# Patient Record
Sex: Female | Born: 1995 | Race: White | Hispanic: No | Marital: Single | Smoking: Current every day smoker
Health system: Southern US, Community
[De-identification: ages and names within clinical notes are randomized; demographics above are authoritative.]

---

## 2018-02-10 ENCOUNTER — Encounter (HOSPITAL_COMMUNITY): Payer: Self-pay | Admitting: Emergency Medicine

## 2018-02-10 ENCOUNTER — Observation Stay (HOSPITAL_COMMUNITY)
Admission: EM | Admit: 2018-02-10 | Discharge: 2018-02-10 | Disposition: A | Discharge status: Home / Self Care | Payer: Self-pay | Attending: Emergency Medicine | Admitting: Emergency Medicine

## 2018-02-10 ENCOUNTER — Other Ambulatory Visit: Payer: Self-pay

## 2018-02-10 ENCOUNTER — Emergency Department (HOSPITAL_COMMUNITY): Payer: Self-pay

## 2018-02-10 DIAGNOSIS — Z885 Allergy status to narcotic agent status: Secondary | ICD-10-CM | POA: Insufficient documentation

## 2018-02-10 DIAGNOSIS — F17210 Nicotine dependence, cigarettes, uncomplicated: Secondary | ICD-10-CM | POA: Insufficient documentation

## 2018-02-10 DIAGNOSIS — G510 Bell's palsy: Principal | ICD-10-CM | POA: Insufficient documentation

## 2018-02-10 DIAGNOSIS — R569 Unspecified convulsions: Secondary | ICD-10-CM

## 2018-02-10 LAB — CBC WITH DIFFERENTIAL/PLATELET
Abs Immature Granulocytes: 0.04 10*3/uL (ref 0.00–0.07)
Basophils Absolute: 0.1 10*3/uL (ref 0.0–0.1)
Basophils Relative: 1 %
EOS ABS: 0.2 10*3/uL (ref 0.0–0.5)
EOS PCT: 2 %
HCT: 39.8 % (ref 36.0–46.0)
HEMOGLOBIN: 12.4 g/dL (ref 12.0–15.0)
Immature Granulocytes: 0 %
LYMPHS ABS: 2.4 10*3/uL (ref 0.7–4.0)
Lymphocytes Relative: 23 %
MCH: 28.2 pg (ref 26.0–34.0)
MCHC: 31.2 g/dL (ref 30.0–36.0)
MCV: 90.7 fL (ref 80.0–100.0)
MONOS PCT: 7 %
Monocytes Absolute: 0.7 10*3/uL (ref 0.1–1.0)
Neutro Abs: 6.9 10*3/uL (ref 1.7–7.7)
Neutrophils Relative %: 67 %
PLATELETS: 306 10*3/uL (ref 150–400)
RBC: 4.39 MIL/uL (ref 3.87–5.11)
RDW: 12.6 % (ref 11.5–15.5)
WBC: 10.3 10*3/uL (ref 4.0–10.5)
nRBC: 0 % (ref 0.0–0.2)

## 2018-02-10 LAB — COMPREHENSIVE METABOLIC PANEL
ALBUMIN: 4.3 g/dL (ref 3.5–5.0)
ALK PHOS: 77 U/L (ref 38–126)
ALT: 21 U/L (ref 0–44)
AST: 25 U/L (ref 15–41)
Anion gap: 9 (ref 5–15)
BUN: 12 mg/dL (ref 6–20)
CALCIUM: 8.9 mg/dL (ref 8.9–10.3)
CHLORIDE: 103 mmol/L (ref 98–111)
CO2: 24 mmol/L (ref 22–32)
CREATININE: 0.83 mg/dL (ref 0.44–1.00)
GFR calc Af Amer: >60 mL/min (ref >60)
GFR calc non Af Amer: >60 mL/min (ref >60)
GLUCOSE: 97 mg/dL (ref 70–99)
Potassium: 3.4 mmol/L — ABNORMAL LOW (ref 3.5–5.1)
SODIUM: 136 mmol/L (ref 135–145)
TOTAL PROTEIN: 8.1 g/dL (ref 6.5–8.1)
Total Bilirubin: 0.5 mg/dL (ref 0.3–1.2)

## 2018-02-10 LAB — I-STAT BETA HCG BLOOD, ED (MC, WL, AP ONLY)

## 2018-02-10 MED ORDER — PREDNISONE 20 MG PO TABS: ORAL_TABLET | ORAL | Status: DC

## 2018-02-10 MED ORDER — PREDNISONE 50 MG PO TABS
60.0000 mg | ORAL_TABLET | Freq: Once | ORAL | Status: AC
  Administered 2018-02-10: 60 mg via ORAL
  Filled 2018-02-10: qty 1

## 2018-02-10 MED ORDER — VALACYCLOVIR HCL 500 MG PO TABS
1000.0000 mg | ORAL_TABLET | Freq: Once | ORAL | Status: AC
  Administered 2018-02-10: 1000 mg via ORAL
  Filled 2018-02-10: qty 2

## 2018-02-10 MED ORDER — ARTIFICIAL TEARS OPHTHALMIC OINT: TOPICAL_OINTMENT | OPHTHALMIC | 0 refills | Status: DC

## 2018-02-10 MED ORDER — VALACYCLOVIR HCL 1 G PO TABS: 1000.0000 mg | ORAL_TABLET | Freq: Three times a day (TID) | ORAL | 0 refills | Status: AC

## 2018-02-10 MED ORDER — HYPROMELLOSE (GONIOSCOPIC) 2.5 % OP SOLN: 1.0000 [drp] | Freq: Four times a day (QID) | OPHTHALMIC | 12 refills | Status: DC | PRN

## 2018-02-10 NOTE — ED Provider Notes (Signed)
[facility] EMERGENCY DEPARTMENT Provider Note   CSN: [id number] Arrival date & time: 02/10/18  1650     History   Chief Complaint Chief Complaint  Patient presents with  . Numbness    HPI Morgan Stanley is a 22 y.o. female.  Patient presents with left facial drooping for a few days.  The history is provided by the patient. No language interpreter was used.  Weakness  Primary symptoms include focal weakness. This is a new problem. The current episode started 2 days ago. The problem has not changed since onset.There was left facial focality noted. There has been no fever. Pertinent negatives include no shortness of breath, no chest pain and no headaches. There were no medications administered prior to arrival. Associated medical issues do not include trauma.    History reviewed. No pertinent past medical history.  Patient Active Problem List   Diagnosis Date Noted  . Seizure (HCC) 02/10/2018    History reviewed. No pertinent surgical history.   OB History   None      Home Medications    Prior to Admission medications   Medication Sig Start Date End Date Taking? Authorizing Provider  aspirin-acetaminophen-caffeine (EXCEDRIN MIGRAINE) [phone] MG tablet Take 2 tablets by mouth every 6 (six) hours as needed for headache.   Yes [provider]  ibuprofen (ADVIL,MOTRIN) 200 MG tablet Take 400 mg by mouth every 6 (six) hours as needed.   Yes [provider]  artificial tears (LACRILUBE) OINT ophthalmic ointment Use and affected by at night and tape your eye shut 02/10/18   Bethann Berkshire, MD  hydroxypropyl methylcellulose / hypromellose (ISOPTO TEARS / GONIOVISC) 2.5 % ophthalmic solution Place 1 drop into the left eye 4 (four) times daily as needed for dry eyes. 02/10/18   Bethann Berkshire, MD  predniSONE (DELTASONE) 20 MG tablet Take 3 tablets once a day 02/10/18   Bethann Berkshire, MD  valACYclovir (VALTREX) 1000 MG tablet Take 1 tablet (1,000 mg total)  by mouth 3 (three) times daily for 7 days. 02/10/18 02/17/18  Bethann Berkshire, MD    Family History History reviewed. No pertinent family history.  Social History Social History   Tobacco Use  . Smoking status: Current Every Day Smoker  . Smokeless tobacco: Never Used  Substance Use Topics  . Alcohol use: Yes    Comment: occasionally  . Drug use: Never     Allergies   Codeine   Review of Systems Review of Systems  Constitutional: Negative for appetite change and fatigue.  HENT: Negative for congestion, ear discharge and sinus pressure.        Left facial weakness  Eyes: Negative for discharge.  Respiratory: Negative for cough and shortness of breath.   Cardiovascular: Negative for chest pain.  Gastrointestinal: Negative for abdominal pain and diarrhea.  Genitourinary: Negative for frequency and hematuria.  Musculoskeletal: Negative for back pain.  Skin: Negative for rash.  Neurological: Positive for focal weakness and weakness. Negative for seizures and headaches.  Psychiatric/Behavioral: Negative for hallucinations.     Physical Exam Updated Vital Signs BP (!) 150/87 (BP Location: Right Arm)   Pulse 93   Temp 98 F (36.7 C) (Oral)   Resp 18   Ht 5\' 7"  (1.702 m)   Wt 77.1 kg   LMP 02/08/2018   SpO2 100%   BMI 26.63 kg/m   Physical Exam  Constitutional: She is oriented to person, place, and time. She appears well-developed.  HENT:  Head: Normocephalic.  Patient has weakness  to the left side of face consistent with Bell's palsy  Eyes: Conjunctivae and EOM are normal. No scleral icterus.  Neck: Neck supple. No thyromegaly present.  Cardiovascular: Normal rate and regular rhythm. Exam reveals no gallop and no friction rub.  No murmur heard. Pulmonary/Chest: No stridor. She has no wheezes. She has no rales. She exhibits no tenderness.  Abdominal: She exhibits no distension. There is no tenderness. There is no rebound.  Musculoskeletal: Normal range of  motion. She exhibits no edema.  Lymphadenopathy:    She has no cervical adenopathy.  Neurological: She is oriented to person, place, and time. She exhibits normal muscle tone. Coordination normal.  Skin: No rash noted. No erythema.  Psychiatric: She has a normal mood and affect. Her behavior is normal.     ED Treatments / Results  Labs (all labs ordered are listed, but only abnormal results are displayed) Labs Reviewed  COMPREHENSIVE METABOLIC PANEL - Abnormal; Notable for the following components:      Result Value   Potassium 3.4 (*)    All other components within normal limits  CBC WITH DIFFERENTIAL/PLATELET  I-STAT BETA HCG BLOOD, ED ([facility], [facility], [facility] ONLY)    EKG None  Radiology Ct Head Wo Contrast  Result Date: 02/10/2018 CLINICAL DATA:  Initial evaluation for acute altered mental status. Left-sided facial numbness and paralysis. EXAM: CT HEAD WITHOUT CONTRAST TECHNIQUE: Contiguous axial images were obtained from the base of the skull through the vertex without intravenous contrast. COMPARISON:  None. FINDINGS: Brain: Cerebral volume within normal limits for patient age. No evidence for acute intracranial hemorrhage. No findings to suggest acute large vessel territory infarct. No mass lesion, midline shift, or mass effect. Ventricles are normal in size without evidence for hydrocephalus. No extra-axial fluid collection identified. Vascular: No hyperdense vessel identified. Skull: Scalp soft tissues demonstrate no acute abnormality. Calvarium intact. Sinuses/Orbits: Globes and orbital soft tissues within normal limits. Visualized paranasal sinuses are clear. No mastoid effusion. IMPRESSION: Normal head CT.  No acute intracranial abnormality. Electronically Signed   By: Rise MuBenjamin  McClintock M.D.   On: 02/10/2018 18:32    Procedures Procedures (including critical care time)  Medications Ordered in ED Medications  predniSONE (DELTASONE) tablet 60 mg (60 mg Oral Given 02/10/18 2121)    valACYclovir (VALTREX) tablet 1,000 mg (1,000 mg Oral Given 02/10/18 2123)     Initial Impression / Assessment and Plan / ED Course  I have reviewed the triage vital signs and the nursing notes.  Pertinent labs & imaging results that were available during my care of the patient were reviewed by me and considered in my medical decision making (see chart for details).     Labs and CT scan unremarkable.  Patient has Bell's palsy and is discharged home on prednisone and Valtrex along with Lacri-Lube ointment and artificial tears.  She will follow-up with her family doctor or neurology  Final Clinical Impressions(s) / ED Diagnoses   Final diagnoses:  Bell's palsy    ED Discharge Orders         Ordered    predniSONE (DELTASONE) 20 MG tablet     02/10/18 2132    valACYclovir (VALTREX) 1000 MG tablet  3 times daily     02/10/18 2132    artificial tears (LACRILUBE) OINT ophthalmic ointment     02/10/18 2132    hydroxypropyl methylcellulose / hypromellose (ISOPTO TEARS / GONIOVISC) 2.5 % ophthalmic solution  4 times daily PRN     02/10/18 2132  Bethann Berkshire, MD 02/10/18 2136

## 2018-02-10 NOTE — ED Triage Notes (Signed)
Patient states she had virus last week and started having facial paralysis and numbness to left side of face prior to going to bed last night. Denies any other weakness or symptoms. Patient ambulatory, alert, and oriented at triage.

## 2018-02-10 NOTE — Discharge Instructions (Signed)
Follow-up with your doctor or Dr. Gerilyn Pilgrimoonquah in 1 to 2 weeks.  Use the artificial tears to keep your eye moist during the day.  Put the ointment in your eye at night and tape your eye shut

## 2019-12-29 IMAGING — CT CT HEAD W/O CM
3 series · 15 of 47 positions shown, 18 images · non-contrast
Comparison: None.

CLINICAL DATA: Initial evaluation for acute altered mental status.
Left-sided facial numbness and paralysis.

EXAM:
CT HEAD WITHOUT CONTRAST
TECHNIQUE: Contiguous axial images were obtained from the base of the skull
through the vertex without intravenous contrast.

[Series 2: head wo · axial · 0.45mm/px · z∈[-110,+15]mm · 9 of 31 slices shown, 12 images]
[im 3/31  brain]
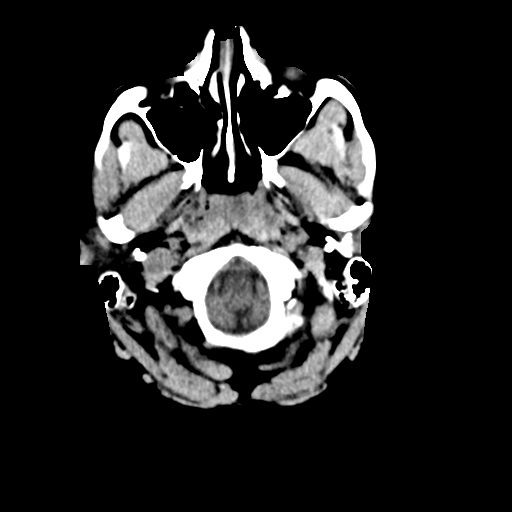
[im 3/31  bone]
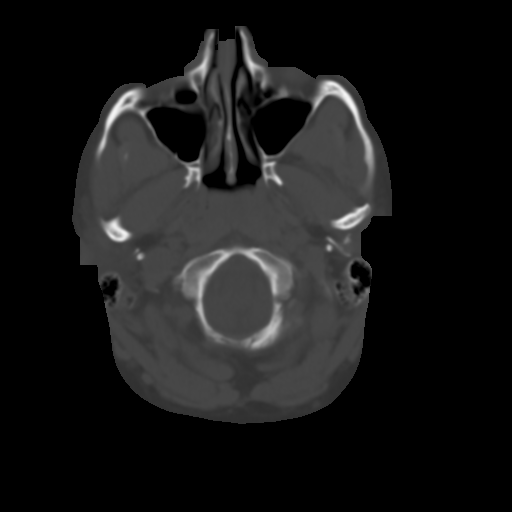
[im 6/31  brain]
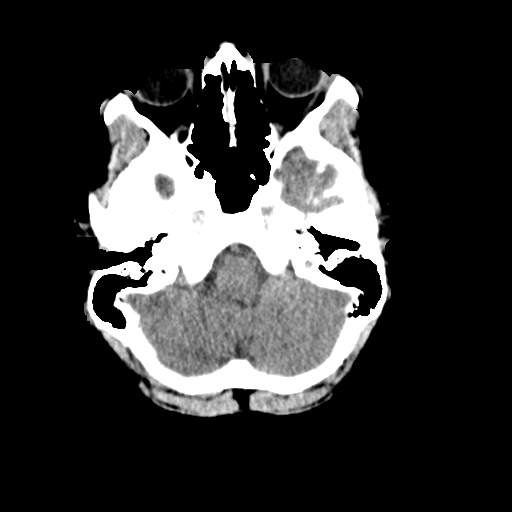
[im 9/31  brain]
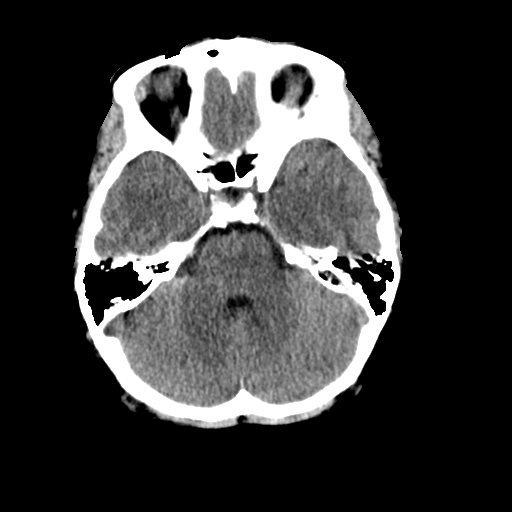
[im 12/31  brain]
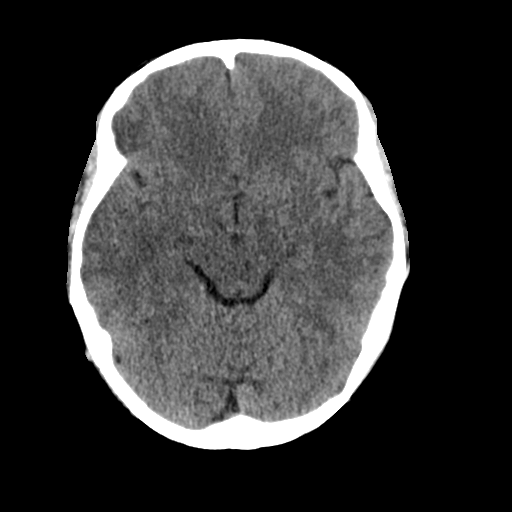
[im 16/31  brain]
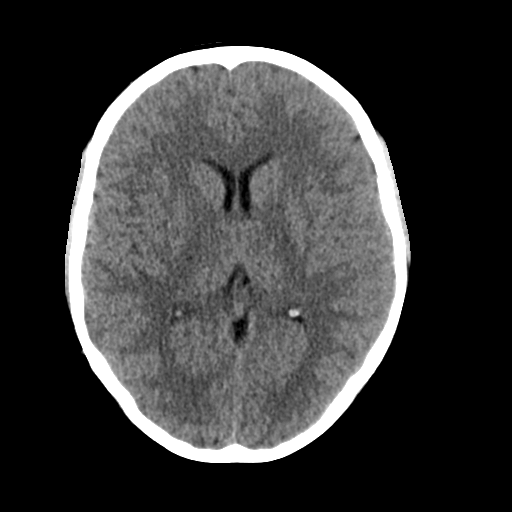
[im 16/31  bone]
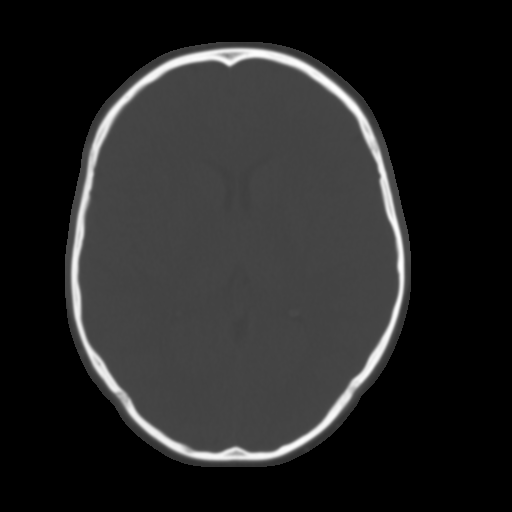
[im 19/31  brain]
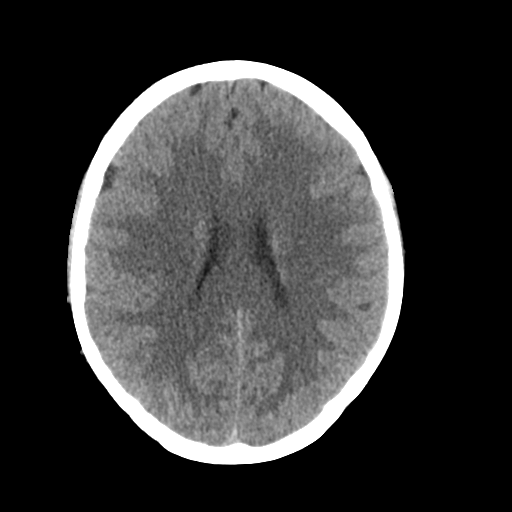
[im 22/31  brain]
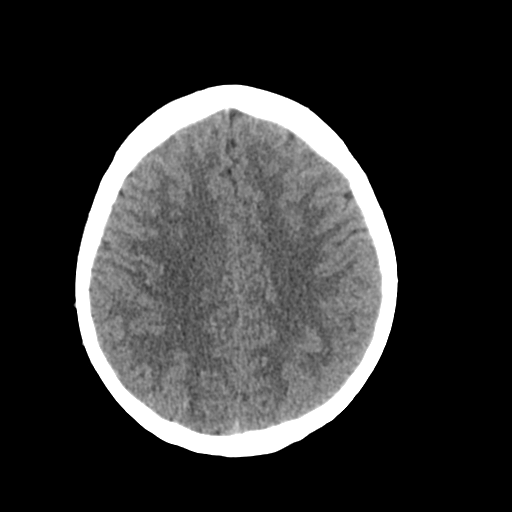
[im 25/31  brain]
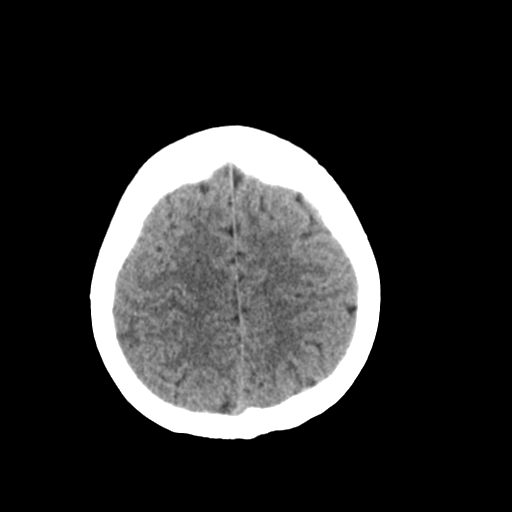
[im 28/31  brain]
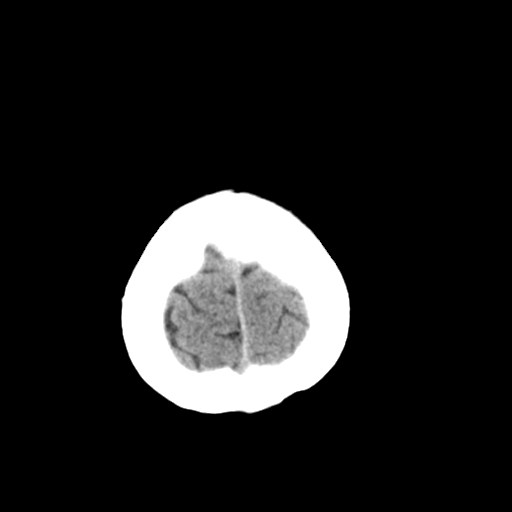
[im 28/31  bone]
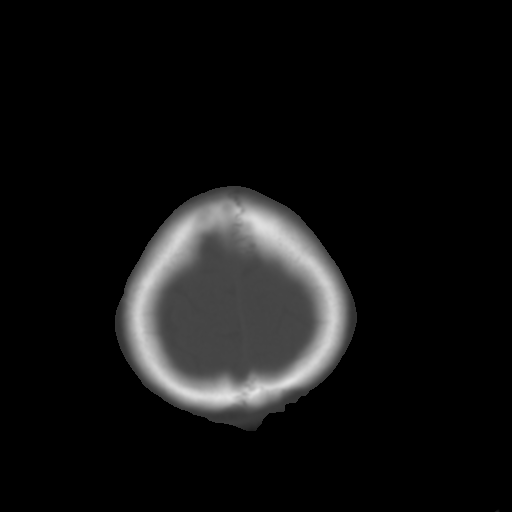

[Series 4: coronal soft tissue · coronal · 0.32mm/px · 3 of 68 slices shown]
[im 23/68  brain]
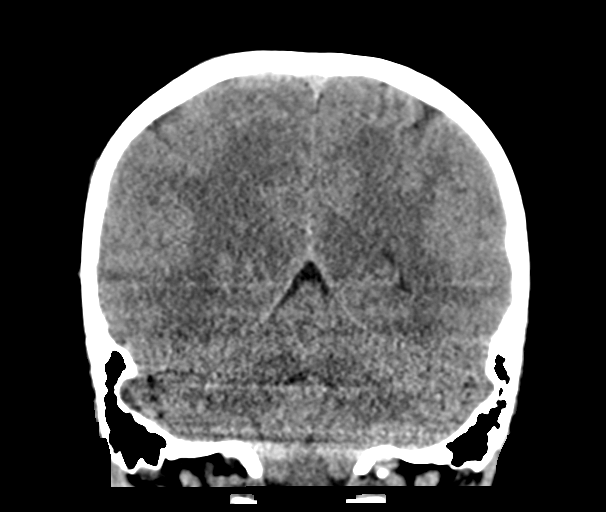
[im 30/68  brain]
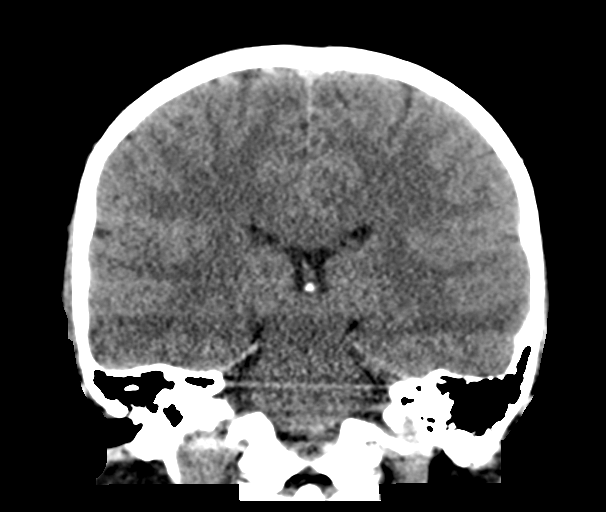
[im 38/68  brain]
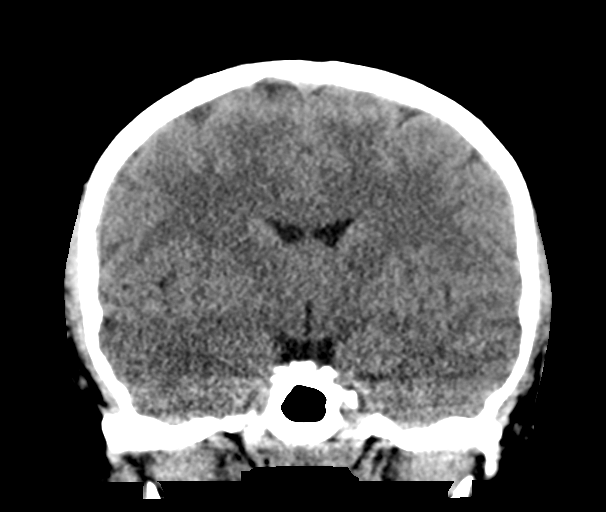

[Series 5: sagittal soft tissue · sagittal · 0.34mm/px · 3 of 63 slices shown]
[im 21/63  brain]
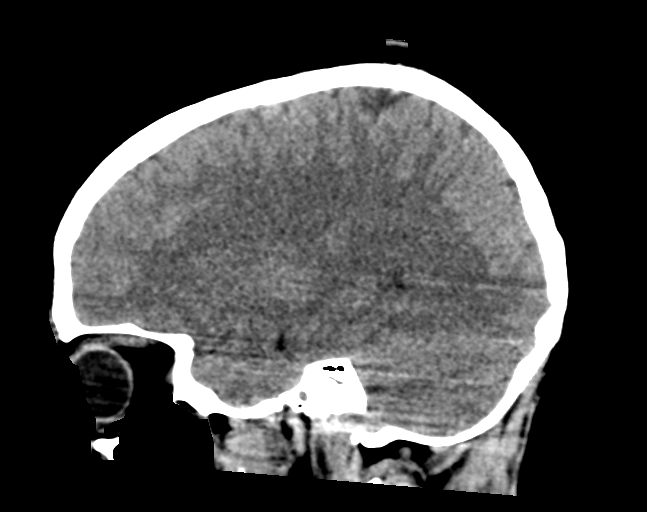
[im 32/63  brain]
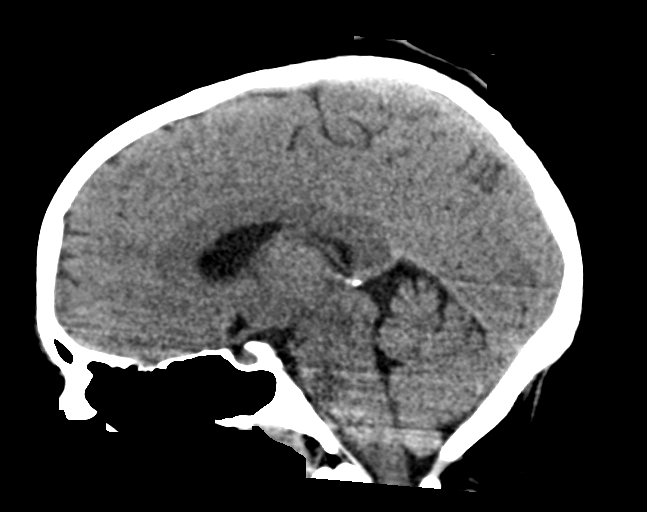
[im 42/63  brain]
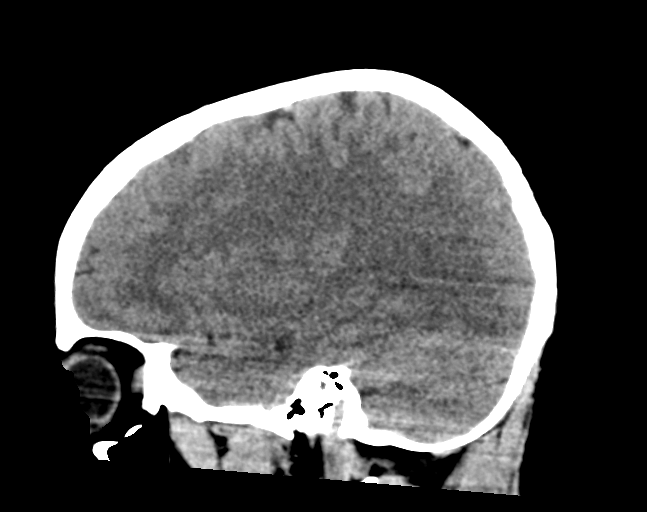

[15 of 47 positions shown; findings below may reference images not displayed]

FINDINGS: Brain: Cerebral volume within normal limits for patient age.

No evidence for acute intracranial hemorrhage. No findings to
suggest acute large vessel territory infarct. No mass lesion,
midline shift, or mass effect. Ventricles are normal in size without
evidence for hydrocephalus. No extra-axial fluid collection
identified.

Vascular: No hyperdense vessel identified.

Skull: Scalp soft tissues demonstrate no acute abnormality.
Calvarium intact.

Sinuses/Orbits: Globes and orbital soft tissues within normal
limits.

Visualized paranasal sinuses are clear. No mastoid effusion.
IMPRESSION: Normal head CT.  No acute intracranial abnormality.

## 2021-05-02 DIAGNOSIS — H5213 Myopia, bilateral: Secondary | ICD-10-CM | POA: Diagnosis not present

## 2021-05-23 DIAGNOSIS — Z973 Presence of spectacles and contact lenses: Secondary | ICD-10-CM | POA: Diagnosis not present

## 2021-05-23 DIAGNOSIS — H1033 Unspecified acute conjunctivitis, bilateral: Secondary | ICD-10-CM | POA: Diagnosis not present

## 2021-05-23 DIAGNOSIS — H5710 Ocular pain, unspecified eye: Secondary | ICD-10-CM | POA: Diagnosis not present

## 2021-05-23 DIAGNOSIS — Z880 Allergy status to penicillin: Secondary | ICD-10-CM | POA: Diagnosis not present

## 2023-01-31 ENCOUNTER — Telehealth: Payer: Self-pay | Admitting: *Deleted

## 2023-01-31 NOTE — Telephone Encounter (Signed)
Ok for pt to keep apt  Copied from CRM [id number]. Topic: Appointments - Appointment Cancel/Reschedule >> Jan 31, 2023 11:17 AM Lovey Newcomer R wrote: Pt just got a message to reschedule for an AM appt for 02/16/2023. Please respond via Mychart or return call at #[phone] advising what times are available.

## 2023-02-16 ENCOUNTER — Ambulatory Visit: Payer: Commercial Managed Care - PPO | Admitting: Family Medicine

## 2023-02-16 ENCOUNTER — Encounter: Payer: Self-pay | Admitting: Family Medicine

## 2023-02-16 VITALS — BP 128/83 | HR 111 | Temp 98.1°F | Ht 67.0 in | Wt 262.0 lb

## 2023-02-16 DIAGNOSIS — R000 Tachycardia, unspecified: Secondary | ICD-10-CM | POA: Diagnosis not present

## 2023-02-16 DIAGNOSIS — N920 Excessive and frequent menstruation with regular cycle: Secondary | ICD-10-CM

## 2023-02-16 DIAGNOSIS — K219 Gastro-esophageal reflux disease without esophagitis: Secondary | ICD-10-CM | POA: Diagnosis not present

## 2023-02-16 MED ORDER — NORETHIN ACE-ETH ESTRAD-FE 1-20 MG-MCG PO TABS: 1.0000 | ORAL_TABLET | Freq: Every day | ORAL | 3 refills | Status: DC

## 2023-02-16 MED ORDER — OMEPRAZOLE 20 MG PO CPDR: 20.0000 mg | DELAYED_RELEASE_CAPSULE | Freq: Every day | ORAL | 3 refills | Status: DC

## 2023-02-16 NOTE — Progress Notes (Signed)
New Patient Office Visit  Subjective    Patient ID: Morgan Stanley, female    DOB: February 11, 1996  Age: 27 y.o. MRN: [id number]  CC:  Chief Complaint  Patient presents with   Establish Care   Gastroesophageal Reflux    Takes otc omeprazole    abnormal peroids    HPI Morgan Stanley presents to establish care.   She has a cycle monthly that last for about 2 weeks. Ongoing for 1.5 years. Bleeding with heavier flow for 2 days, though she has not saturated a pad or tampon within an hour or two. Remaining days are moderate to light. Cycles are not painful. She is not currently sexually active and has not been for 2 years. Was previously sexually active. She has never had a pap. She was on OCPs at one point while on Accutane. When on OCPs she had bleeding daily for 3 weeks so she stopped taking it. She is currently on her cycle.   History of GERD. Has regurgitation at night every night. She was taking omeprazole with good relief but stopped taking due to cost.   HR is up today. 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 She denies palpitations, dizziness, shortness of breath, edema, chest pain. She has an apple watch where she can check her HR.   Outpatient Encounter Medications as of 02/16/2023  Medication Sig   omeprazole (PRILOSEC OTC) 20 MG tablet Take 20 mg by mouth daily.   [DISCONTINUED] artificial tears (LACRILUBE) OINT ophthalmic ointment Use and affected by at night and tape your eye shut   [DISCONTINUED] aspirin-acetaminophen-caffeine (EXCEDRIN MIGRAINE) 250-250-65 MG tablet Take 2 tablets by mouth every 6 (six) hours as needed for headache.   [DISCONTINUED] hydroxypropyl methylcellulose / hypromellose (ISOPTO TEARS / GONIOVISC) 2.5 % ophthalmic solution Place 1 drop into the left eye 4 (four) times daily as needed for dry eyes.   [DISCONTINUED] ibuprofen (ADVIL,MOTRIN) 200 MG tablet Take 400 mg by mouth every 6 (six) hours as needed.    [DISCONTINUED] predniSONE (DELTASONE) 20 MG tablet Take 3 tablets once a day   No facility-administered encounter medications on file as of 02/16/2023.    History reviewed. No pertinent past medical history.  History reviewed. No pertinent surgical history.  Family History  Problem Relation Age of Onset   Varicose Veins Mother    Multiple sclerosis Mother    GI problems Mother    COPD Maternal Grandmother    Cancer Paternal Grandmother        breast cancer   Alzheimer's disease Paternal Grandmother    Dementia Paternal Grandmother     Social History   Socioeconomic History   Marital status: Single    Spouse name: Not on file   Number of children: Not on file   Years of education: Not on file   Highest education level: 12th grade  Occupational History   Not on file  Tobacco Use   Smoking status: Every Day   Smokeless tobacco: Never  Vaping Use   Vaping status: Every Day  Substance and Sexual Activity   Alcohol use: Yes    Alcohol/week: 3.0 standard drinks of alcohol    Types: 3 Cans of beer per week    Comment: occasionally   Drug use: Never   Sexual activity: Not Currently  Other Topics Concern   Not on file  Social History Narrative   Not on file   Social Determinants of Health  Financial Resource Strain: Medium Risk (02/09/2023)   Overall Financial Resource Strain (CARDIA)    Difficulty of Paying Living Expenses: Somewhat hard  Food Insecurity: No Food Insecurity (02/09/2023)   Hunger Vital Sign    Worried About Running Out of Food in the Last Year: Never true    Ran Out of Food in the Last Year: Never true  Transportation Needs: No Transportation Needs (02/09/2023)   PRAPARE - Administrator, Civil Service (Medical): No    Lack of Transportation (Non-Medical): No  Physical Activity: Insufficiently Active (02/09/2023)   Exercise Vital Sign    Days of Exercise per Week: 1 day    Minutes of Exercise per Session: 30 min  Stress: Stress  Concern Present (02/09/2023)   [employer] of Occupational Health - Occupational Stress Questionnaire    Feeling of Stress : Rather much  Social Connections: Moderately Integrated (02/09/2023)   Social Connection and Isolation Panel [NHANES]    Frequency of Communication with Friends and Family: More than three times a week    Frequency of Social Gatherings with Friends and Family: Twice a week    Attends Religious Services: 1 to 4 times per year    Active Member of [employer] or Organizations: No    Attends Engineer, structural: Not on file    Marital Status: Living with partner  Intimate Partner Violence: Not on file    ROS Negative unless specially indicated above in HPI.     Objective    BP 128/83   Pulse (!) 111   Temp 98.1 F (36.7 C) (Temporal)   Ht 5\' 7"  (1.702 m)   Wt 262 lb (118.8 kg)   LMP 02/16/2023   SpO2 100%   BMI 41.04 kg/m   Physical Exam Vitals and nursing note reviewed.  Constitutional:      General: She is not in acute distress.    Appearance: She is obese. She is not ill-appearing, toxic-appearing or diaphoretic.  Cardiovascular:     Rate and Rhythm: Regular rhythm. Tachycardia present.     Heart sounds: Normal heart sounds. No murmur heard. Pulmonary:     Effort: Pulmonary effort is normal. No respiratory distress.     Breath sounds: Normal breath sounds. No wheezing.  Musculoskeletal:     Cervical back: Neck supple. No rigidity.     Right lower leg: No edema.     Left lower leg: No edema.  Skin:    General: Skin is warm and dry.  Neurological:     General: No focal deficit present.     Mental Status: She is alert and oriented to person, place, and time.  Psychiatric:        Mood and Affect: Mood normal.        Behavior: Behavior normal.         Assessment & Plan:   Morgan Stanley was seen today for establish care, gastroesophageal reflux and abnormal peroids.  Diagnoses and all orders for this visit:  Gastroesophageal reflux  disease, unspecified whether esophagitis present Uncontrolled. Restart omeprazole.  -     omeprazole (PRILOSEC) 20 MG capsule; Take 1 capsule (20 mg total) by mouth daily.  Menorrhagia with regular cycle Will start OCPs. Currently of cycle now. Discussed Sunday start. Not sexually active.  -     norethindrone-ethinyl estradiol-FE (LOESTRIN FE) 1-20 MG-MCG tablet; Take 1 tablet by mouth daily.  Tachycardia Asymptomatic. ? Nervous today. Monitor HR at home with apple watch and notify if HR elevated.  Return for CPE with pap and fasting labs. Sooner for new or worsening symptoms.   The patient indicates understanding of these issues and agrees with the plan.  Gabriel Earing, FNP

## 2023-03-08 ENCOUNTER — Encounter: Payer: Self-pay | Admitting: Family Medicine

## 2023-03-09 ENCOUNTER — Other Ambulatory Visit: Payer: Self-pay | Admitting: Family Medicine

## 2023-03-09 DIAGNOSIS — N920 Excessive and frequent menstruation with regular cycle: Secondary | ICD-10-CM

## 2023-04-05 ENCOUNTER — Encounter: Payer: Self-pay | Admitting: Family Medicine

## 2023-05-02 ENCOUNTER — Encounter: Payer: Self-pay | Admitting: Family Medicine

## 2023-05-03 ENCOUNTER — Encounter: Payer: Self-pay | Admitting: Certified Nurse Midwife

## 2023-05-09 ENCOUNTER — Encounter: Payer: Self-pay | Admitting: Family Medicine

## 2023-05-13 ENCOUNTER — Encounter: Payer: Commercial Managed Care - PPO | Admitting: Obstetrics and Gynecology

## 2023-05-17 ENCOUNTER — Ambulatory Visit: Payer: Self-pay | Admitting: Family Medicine

## 2023-05-17 ENCOUNTER — Encounter: Payer: Self-pay | Admitting: Family Medicine

## 2023-05-17 ENCOUNTER — Ambulatory Visit: Payer: Commercial Managed Care - PPO | Admitting: Family Medicine

## 2023-05-17 VITALS — BP 120/75 | HR 120 | Temp 98.0°F | Ht <= 58 in | Wt 262.0 lb

## 2023-05-17 DIAGNOSIS — J40 Bronchitis, not specified as acute or chronic: Secondary | ICD-10-CM | POA: Diagnosis not present

## 2023-05-17 DIAGNOSIS — J329 Chronic sinusitis, unspecified: Secondary | ICD-10-CM

## 2023-05-17 DIAGNOSIS — R051 Acute cough: Secondary | ICD-10-CM

## 2023-05-17 LAB — RAPID STREP SCREEN (MED CTR MEBANE ONLY): Strep Gp A Ag, IA W/Reflex: NEGATIVE

## 2023-05-17 LAB — CULTURE, GROUP A STREP

## 2023-05-17 MED ORDER — CEFUROXIME AXETIL 250 MG PO TABS: 250.0000 mg | ORAL_TABLET | Freq: Two times a day (BID) | ORAL | 0 refills | Status: AC

## 2023-05-17 NOTE — Telephone Encounter (Signed)
 Appt today

## 2023-05-17 NOTE — Telephone Encounter (Signed)
  Chief Complaint: Sore throat Symptoms: pain, hoarse voice, fever, cough, dizzy Frequency: Began yesterday Pertinent Negatives: Patient denies CP, NVD Disposition: [] ED /[] Urgent Care (no appt availability in office) / [x] Appointment(In office/virtual)/ []  [facility]/ [] Home Care/ [] Refused Recommended Disposition /[] [place] [facility]/ []  Follow-up with PCP Additional Notes: Patient calls reporting sore throat with 10/10 pain. States she feels hot and sweaty, hoarse voice, cough, dizzy. 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 Per protocol, patient to be evaluated within 24 hours. First available appointment with PCP outside of guidelines. Patient scheduled with first available provider in clinic for 05/17/23 @ 1055. Care advice reviewed, patient verbalized understandingand denies further questions at this time. Alerting PCP for review.    Reason for Disposition  SEVERE (e.g., excruciating) throat pain  Answer Assessment - Initial Assessment Questions 1. ONSET: "When did the throat start hurting?" (Hours or days ago)      Yesterday 2. SEVERITY: "How bad is the sore throat?" (Scale 1-10; mild, moderate or severe)   - MILD (1-3):  Doesn't interfere with eating or normal activities.   - MODERATE (4-7): Interferes with eating some solids and normal activities.   - SEVERE (8-10):  Excruciating pain, interferes with most normal activities.   - SEVERE WITH DYSPHAGIA (10): Can't swallow liquids, drooling.     10/10 3. STREP EXPOSURE: "Has there been any exposure to strep within the past week?" If Yes, ask: "What type of contact occurred?"      Works in pediatrics 4.  VIRAL SYMPTOMS: "Are there any symptoms of a cold, such as a runny nose, cough, hoarse voice or red eyes?"      Cough, dizzy, sweating, hoarse voice 5. FEVER: "Do you have a fever?" If Yes, ask: "What is your temperature, how was it measured, and when did it start?"     No  thermometer, feels hot. 6. PUS ON THE TONSILS: "Is there pus on the tonsils in the back of your throat?"     Has not looked 7. OTHER SYMPTOMS: "Do you have any other symptoms?" (e.g., difficulty breathing, headache, rash)     States she woke up last night with some SOB, states when sitting up she felt better. 8. PREGNANCY: "Is there any chance you are pregnant?" "When was your last menstrual period?"     Denies, LMP: last week  Protocols used: Sore Throat-A-AH

## 2023-05-17 NOTE — Addendum Note (Signed)
 Addended by: Sindy Messing on: 05/17/2023 11:57 AM   Modules accepted: Orders

## 2023-05-17 NOTE — Progress Notes (Signed)
 Chief Complaint  Patient presents with   Sore Throat    Started yesterday. Bad cough that is productive that is painful, sore throat, feels feverish with night sweats. and headache. Needs work note.     HPI  Patient presents today for Patient presents with upper respiratory congestion. Rhinorrhea that is frequently purulent. There isbad sore throat. Patient reports coughing frequently as well.  Green sputum noted. There is fever, chills  sweats. The patient denies being short of breath. Onset was yesterday.Gradually worsening. Tried OTCs without improvement.  PMH: Smoking status noted ROS: Per HPI  Objective: BP 120/75   Pulse (!) 120   Temp 98 F (36.7 C)   Ht 2' (0.61 m)   Wt 262 lb (118.8 kg)   SpO2 96%   BMI 319.80 kg/m  Gen: NAD, alert, cooperative with exam HEENT: NCAT, Nasal passages swollen, red TMS clear max sinuses tender CV: RRR, good S1/S2, no murmur Resp: Bronchitis changes with scattered wheezes, non-labored Ext: No edema, warm Neuro: Alert and oriented, No gross deficits  Assessment and plan:  1. Acute cough   2. Sinobronchitis     Meds ordered this encounter  Medications   cefUROXime (CEFTIN) 250 MG tablet    Sig: Take 1 tablet (250 mg total) by mouth 2 (two) times daily with a meal for 10 days.    Dispense:  20 tablet    Refill:  0    Orders Placed This Encounter  Procedures   Rapid Strep Screen ([facility] ONLY)   COVID-19, Flu A+B and RSV    Previously tested for COVID-19:   Unknown    Resident in a congregate (group) care setting:   Unknown    Is the patient student?:   No    Employed in healthcare setting:   Unknown    Pregnant:   No    Has patient completed COVID vaccination(s) (2 doses of Pfizer/Moderna 1 dose of [employer]):   Unknown   Veritor Flu A/B Waived    Source:   nasal    Release to patient:   Immediate    Follow up as needed.  Mechele Claude, MD

## 2023-05-18 LAB — COVID-19, FLU A+B AND RSV
Influenza A, NAA: DETECTED — AB
Influenza B, NAA: NOT DETECTED
RSV, NAA: NOT DETECTED
SARS-CoV-2, NAA: NOT DETECTED

## 2023-05-19 ENCOUNTER — Encounter: Payer: Self-pay | Admitting: Family Medicine

## 2023-05-20 ENCOUNTER — Other Ambulatory Visit: Payer: Self-pay | Admitting: Family Medicine

## 2023-05-20 ENCOUNTER — Encounter: Payer: Self-pay | Admitting: Family Medicine

## 2023-05-20 MED ORDER — OSELTAMIVIR PHOSPHATE 75 MG PO CAPS: 75.0000 mg | ORAL_CAPSULE | Freq: Two times a day (BID) | ORAL | 0 refills | Status: DC

## 2023-06-05 NOTE — Progress Notes (Unsigned)
   GYNECOLOGY PROGRESS NOTE  History:  28 y.o. No obstetric history on file. presents to [facility] office today for problem gyn visit.  The following portions of the patient's history were reviewed and updated as appropriate: allergies, current medications, past family history, past medical history, past social history, past surgical history and problem list. Last pap smear on *** was normal, *** HRHPV.  Health Maintenance Due  Topic Date Due   HIV Screening  Never done   Hepatitis C Screening  Never done   COVID-19 Vaccine (1 - 2024-25 season) Never done     Review of Systems:  Pertinent items are noted in HPI.   Objective:  Physical Exam There were no vitals taken for this visit. VS reviewed, nursing note reviewed,  Constitutional: well developed, well nourished, no distress HEENT: normocephalic CV: normal rate Pulm/chest wall: normal effort Breast Exam: deferred Abdomen: soft Neuro: alert and oriented x 3 Skin: warm, dry Psych: affect normal Pelvic exam: Cervix pink, visually closed, without lesion, scant white creamy discharge, vaginal walls and external genitalia normal Bimanual exam: Cervix 0/long/high, firm, anterior, neg CMT, uterus nontender, nonenlarged, adnexa without tenderness, enlargement, or mass  Assessment & Plan:  There are no diagnoses linked to this encounter.  No follow-ups on file.   Albertine Grates, FNP 8:38 PM

## 2023-06-06 ENCOUNTER — Ambulatory Visit: Payer: Commercial Managed Care - PPO | Admitting: Obstetrics and Gynecology

## 2023-06-06 ENCOUNTER — Encounter: Payer: Self-pay | Admitting: Obstetrics and Gynecology

## 2023-06-06 ENCOUNTER — Other Ambulatory Visit: Payer: Self-pay

## 2023-06-06 ENCOUNTER — Other Ambulatory Visit (HOSPITAL_COMMUNITY)
Admission: RE | Admit: 2023-06-06 | Discharge: 2023-06-06 | Disposition: A | Discharge status: Home / Self Care | Source: Ambulatory Visit | Attending: Obstetrics and Gynecology | Admitting: Obstetrics and Gynecology

## 2023-06-06 VITALS — BP 134/94 | HR 88 | Wt 260.0 lb

## 2023-06-06 DIAGNOSIS — Z7689 Persons encountering health services in other specified circumstances: Secondary | ICD-10-CM | POA: Diagnosis not present

## 2023-06-06 DIAGNOSIS — N898 Other specified noninflammatory disorders of vagina: Secondary | ICD-10-CM | POA: Insufficient documentation

## 2023-06-06 DIAGNOSIS — Z1151 Encounter for screening for human papillomavirus (HPV): Secondary | ICD-10-CM | POA: Diagnosis not present

## 2023-06-06 DIAGNOSIS — Z01419 Encounter for gynecological examination (general) (routine) without abnormal findings: Secondary | ICD-10-CM | POA: Diagnosis not present

## 2023-06-06 DIAGNOSIS — Z124 Encounter for screening for malignant neoplasm of cervix: Secondary | ICD-10-CM | POA: Diagnosis present

## 2023-06-06 LAB — POCT URINALYSIS DIP (DEVICE)
Bilirubin Urine: NEGATIVE
Glucose, UA: NEGATIVE mg/dL
Ketones, ur: NEGATIVE mg/dL
Leukocytes,Ua: NEGATIVE
Nitrite: NEGATIVE
Protein, ur: 30 mg/dL — AB
Specific Gravity, Urine: 1.025 (ref 1.005–1.030)
Urobilinogen, UA: 0.2 mg/dL (ref 0.0–1.0)
pH: 7 (ref 5.0–8.0)

## 2023-06-08 LAB — CERVICOVAGINAL ANCILLARY ONLY
Bacterial Vaginitis (gardnerella): NEGATIVE
Candida Glabrata: NEGATIVE
Candida Vaginitis: NEGATIVE
Chlamydia: NEGATIVE
Comment: NEGATIVE
Comment: NEGATIVE
Comment: NEGATIVE
Comment: NEGATIVE
Comment: NEGATIVE
Comment: NORMAL
Neisseria Gonorrhea: NEGATIVE
Trichomonas: NEGATIVE

## 2023-06-09 ENCOUNTER — Encounter: Payer: Commercial Managed Care - PPO | Admitting: Family Medicine

## 2023-06-13 LAB — CYTOLOGY - PAP
Comment: NEGATIVE
High risk HPV: POSITIVE — AB

## 2023-06-14 ENCOUNTER — Telehealth: Payer: Self-pay | Admitting: Obstetrics and Gynecology

## 2023-06-14 NOTE — Telephone Encounter (Signed)
 Telephone call

## 2023-06-17 ENCOUNTER — Other Ambulatory Visit: Payer: Self-pay

## 2023-06-17 ENCOUNTER — Ambulatory Visit: Admitting: Family Medicine

## 2023-06-17 ENCOUNTER — Encounter: Payer: Self-pay | Admitting: Family Medicine

## 2023-06-17 ENCOUNTER — Other Ambulatory Visit (HOSPITAL_COMMUNITY): Payer: Self-pay

## 2023-06-17 VITALS — BP 120/85 | HR 97 | Temp 97.7°F | Ht 67.0 in | Wt 263.6 lb

## 2023-06-17 DIAGNOSIS — K219 Gastro-esophageal reflux disease without esophagitis: Secondary | ICD-10-CM

## 2023-06-17 DIAGNOSIS — Z6841 Body Mass Index (BMI) 40.0 and over, adult: Secondary | ICD-10-CM | POA: Diagnosis not present

## 2023-06-17 DIAGNOSIS — Z0000 Encounter for general adult medical examination without abnormal findings: Secondary | ICD-10-CM

## 2023-06-17 DIAGNOSIS — N920 Excessive and frequent menstruation with regular cycle: Secondary | ICD-10-CM

## 2023-06-17 DIAGNOSIS — Z1159 Encounter for screening for other viral diseases: Secondary | ICD-10-CM

## 2023-06-17 DIAGNOSIS — Z114 Encounter for screening for human immunodeficiency virus [HIV]: Secondary | ICD-10-CM | POA: Diagnosis not present

## 2023-06-17 DIAGNOSIS — Z0001 Encounter for general adult medical examination with abnormal findings: Secondary | ICD-10-CM

## 2023-06-17 LAB — BAYER DCA HB A1C WAIVED: HB A1C (BAYER DCA - WAIVED): 5.1 % (ref 4.8–5.6)

## 2023-06-17 MED ORDER — OMEPRAZOLE 20 MG PO CPDR
20.0000 mg | DELAYED_RELEASE_CAPSULE | Freq: Every day | ORAL | 3 refills | Status: AC
  Filled 2023-06-17 (×2): qty 90, 90d supply, fill #0
  Filled 2023-09-11: qty 90, 90d supply, fill #1
  Filled 2024-01-06 – 2024-03-30 (×2): qty 90, 90d supply, fill #0
  Filled 2024-03-30: qty 90, 90d supply, fill #1

## 2023-06-17 MED ORDER — NORETHIN ACE-ETH ESTRAD-FE 1-20 MG-MCG PO TABS
1.0000 | ORAL_TABLET | Freq: Every day | ORAL | 3 refills | Status: AC
Start: 2023-06-17 — End: ?

## 2023-06-17 NOTE — Progress Notes (Signed)
 Complete physical exam  Patient: Morgan Stanley   DOB: 01-09-1996   28 y.o. Female  MRN: [id number]  Subjective:    Chief Complaint  Patient presents with   Annual Exam    Morgan Stanley is a 28 y.o. female who presents today for a complete physical exam. She reports consuming a general diet. Gym/ health club routine includes light weights and treadmill. She generally feels well. She reports sleeping well. She does not have additional problems to discuss today.   Cycles are well controlled with OCPs.   GERD well controlled with omeprazole.   Most recent fall risk assessment:     No data to display           Most recent depression screenings:    06/06/2023   10:39 AM 02/16/2023    2:42 PM  PHQ 2/9 Scores  PHQ - 2 Score 1 1  PHQ- 9 Score 3 9    Vision:Within last year and Dental: No current dental problems and Receives regular dental care  History reviewed. No pertinent past medical history.    Patient Care Team: Gabriel Earing, FNP as PCP - General (Family Medicine)   Outpatient Medications Prior to Visit  Medication Sig   norethindrone-ethinyl estradiol-FE (LOESTRIN FE) 1-20 MG-MCG tablet Take 1 tablet by mouth daily.   omeprazole (PRILOSEC) 20 MG capsule Take 1 capsule (20 mg total) by mouth daily.   [DISCONTINUED] oseltamivir (TAMIFLU) 75 MG capsule Take 1 capsule (75 mg total) by mouth 2 (two) times daily.   No facility-administered medications prior to visit.    ROS As per HPI.      Objective:     BP 120/85   Pulse 97   Temp 97.7 F (36.5 C)   Ht 5\' 7"  (1.702 m)   Wt 263 lb 9.6 oz (119.6 kg)   LMP 06/03/2023   SpO2 98%   BMI 41.29 kg/m    Physical Exam Vitals and nursing note reviewed.  Constitutional:      General: She is not in acute distress.    Appearance: She is obese. She is not ill-appearing, toxic-appearing or diaphoretic.  HENT:     Head: Normocephalic.     Right Ear: Tympanic membrane, ear canal and external ear normal.      Left Ear: Tympanic membrane, ear canal and external ear normal.     Nose: Nose normal.     Mouth/Throat:     Mouth: Mucous membranes are moist.     Pharynx: Oropharynx is clear.  Eyes:     Extraocular Movements: Extraocular movements intact.     Conjunctiva/sclera: Conjunctivae normal.     Pupils: Pupils are equal, round, and reactive to light.  Neck:     Thyroid: No thyroid mass, thyromegaly or thyroid tenderness.  Cardiovascular:     Rate and Rhythm: Normal rate and regular rhythm.     Pulses: Normal pulses.     Heart sounds: Normal heart sounds. No murmur heard.    No friction rub. No gallop.  Pulmonary:     Effort: Pulmonary effort is normal.     Breath sounds: Normal breath sounds.  Abdominal:     General: Bowel sounds are normal. There is no distension.     Palpations: Abdomen is soft. There is no mass.     Tenderness: There is no abdominal tenderness. There is no guarding.  Musculoskeletal:        General: No swelling or tenderness. Normal range of motion.  Cervical back: Normal range of motion and neck supple. No tenderness.     Right lower leg: No edema.     Left lower leg: No edema.  Skin:    General: Skin is warm and dry.     Capillary Refill: Capillary refill takes less than 2 seconds.     Findings: No lesion or rash.  Neurological:     General: No focal deficit present.     Mental Status: She is alert and oriented to person, place, and time.     Cranial Nerves: No cranial nerve deficit.     Motor: No weakness.     Gait: Gait normal.  Psychiatric:        Mood and Affect: Mood normal.        Behavior: Behavior normal.        Thought Content: Thought content normal.        Judgment: Judgment normal.      No results found for any visits on 06/17/23.     Assessment & Plan:    Routine Health Maintenance and Physical Exam  Morgan Stanley was seen today for annual exam.  Diagnoses and all orders for this visit:  Routine general medical examination at a  health care facility  Morbid obesity (HCC) Fasting labs pending. Diet, exercise, weight loss.  -     CBC with Differential/Platelet -     CMP14+EGFR -     Lipid panel -     TSH -     Bayer DCA Hb A1c Waived  Gastroesophageal reflux disease, unspecified whether esophagitis present Well controlled on current regimen.  -     omeprazole (PRILOSEC) 20 MG capsule; Take 1 capsule (20 mg total) by mouth daily.  Menorrhagia with regular cycle Well controlled on current regimen.  -     norethindrone-ethinyl estradiol-FE (LOESTRIN FE) 1-20 MG-MCG tablet; Take 1 tablet by mouth daily.  Need for hepatitis C screening test -     Hepatitis C antibody  Encounter for screening for HIV -     HIV antibody (with reflex)   Immunization History  Administered Date(s) Administered   Influenza-Unspecified 12/21/2022   PPD Test 05/06/2021    Health Maintenance  Topic Date Due   COVID-19 Vaccine (1 - 2024-25 season) 07/03/2023 (Originally 11/21/2022)   DTaP/Tdap/Td (1 - Tdap) 02/16/2024 (Originally 03/26/2016)   HPV VACCINES (1 - 3-dose series) 02/16/2024 (Originally 03/26/2012)   Pneumococcal Vaccine 78-68 Years old (1 of 2 - PCV) 05/16/2024 (Originally 03/27/2003)   Hepatitis C Screening  06/16/2024 (Originally 03/27/2015)   HIV Screening  06/16/2024 (Originally 03/26/2012)   Cervical Cancer Screening (Pap smear)  06/06/2026   INFLUENZA VACCINE  Completed    Discussed health benefits of physical activity, and encouraged her to engage in regular exercise appropriate for her age and condition.  Problem List Items Addressed This Visit   None Visit Diagnoses       Routine general medical examination at a health care facility    -  Primary     Morbid obesity (HCC)       Relevant Orders   CBC with Differential/Platelet   CMP14+EGFR   Lipid panel   TSH   Bayer DCA Hb A1c Waived     Gastroesophageal reflux disease, unspecified whether esophagitis present       Relevant Medications   omeprazole  (PRILOSEC) 20 MG capsule     Menorrhagia with regular cycle       Relevant Medications   norethindrone-ethinyl estradiol-FE (  LOESTRIN FE) 1-20 MG-MCG tablet     Need for hepatitis C screening test       Relevant Orders   Hepatitis C antibody     Encounter for screening for HIV       Relevant Orders   HIV antibody (with reflex)      Return in 1 year (on 06/16/2024).   The patient indicates understanding of these issues and agrees with the plan.  Gabriel Earing, FNP

## 2023-06-17 NOTE — Patient Instructions (Signed)

## 2023-06-18 LAB — CMP14+EGFR
ALT: 17 IU/L (ref 0–32)
AST: 16 IU/L (ref 0–40)
Albumin: 4 g/dL (ref 4.0–5.0)
Alkaline Phosphatase: 99 IU/L (ref 44–121)
BUN/Creatinine Ratio: 14 (ref 9–23)
BUN: 13 mg/dL (ref 6–20)
Bilirubin Total: <0.2 mg/dL (ref 0.0–1.2)
CO2: 22 mmol/L (ref 20–29)
Calcium: 9.1 mg/dL (ref 8.7–10.2)
Chloride: 103 mmol/L (ref 96–106)
Creatinine, Ser: 0.91 mg/dL (ref 0.57–1.00)
Globulin, Total: 2.8 g/dL (ref 1.5–4.5)
Glucose: 82 mg/dL (ref 70–99)
Potassium: 4.3 mmol/L (ref 3.5–5.2)
Sodium: 143 mmol/L (ref 134–144)
Total Protein: 6.8 g/dL (ref 6.0–8.5)
eGFR: 89 mL/min/{1.73_m2} (ref >59)

## 2023-06-18 LAB — LIPID PANEL
Chol/HDL Ratio: 4.5 ratio — ABNORMAL HIGH (ref 0.0–4.4)
Cholesterol, Total: 206 mg/dL — ABNORMAL HIGH (ref 100–199)
HDL: 46 mg/dL (ref >39)
LDL Chol Calc (NIH): 135 mg/dL — ABNORMAL HIGH (ref 0–99)
Triglycerides: 142 mg/dL (ref 0–149)
VLDL Cholesterol Cal: 25 mg/dL (ref 5–40)

## 2023-06-18 LAB — CBC WITH DIFFERENTIAL/PLATELET
Basophils Absolute: 0 10*3/uL (ref 0.0–0.2)
Basos: 1 %
EOS (ABSOLUTE): 0.2 10*3/uL (ref 0.0–0.4)
Eos: 2 %
Hematocrit: 37.8 % (ref 34.0–46.6)
Hemoglobin: 12.1 g/dL (ref 11.1–15.9)
Immature Grans (Abs): 0 10*3/uL (ref 0.0–0.1)
Immature Granulocytes: 0 %
Lymphocytes Absolute: 1.7 10*3/uL (ref 0.7–3.1)
Lymphs: 19 %
MCH: 26.9 pg (ref 26.6–33.0)
MCHC: 32 g/dL (ref 31.5–35.7)
MCV: 84 fL (ref 79–97)
Monocytes Absolute: 0.4 10*3/uL (ref 0.1–0.9)
Monocytes: 5 %
Neutrophils Absolute: 6.6 10*3/uL (ref 1.4–7.0)
Neutrophils: 73 %
Platelets: 232 10*3/uL (ref 150–450)
RBC: 4.49 x10E6/uL (ref 3.77–5.28)
RDW: 13.3 % (ref 11.7–15.4)
WBC: 8.9 10*3/uL (ref 3.4–10.8)

## 2023-06-18 LAB — HEPATITIS C ANTIBODY: Hep C Virus Ab: NONREACTIVE

## 2023-06-18 LAB — HIV ANTIBODY (ROUTINE TESTING W REFLEX): HIV Screen 4th Generation wRfx: NONREACTIVE

## 2023-06-18 LAB — TSH: TSH: 1.6 u[IU]/mL (ref 0.450–4.500)

## 2023-06-20 ENCOUNTER — Encounter: Payer: Self-pay | Admitting: Family Medicine

## 2023-06-27 ENCOUNTER — Other Ambulatory Visit (HOSPITAL_COMMUNITY): Payer: Self-pay

## 2023-07-25 ENCOUNTER — Ambulatory Visit: Admitting: Obstetrics and Gynecology

## 2023-08-30 ENCOUNTER — Ambulatory Visit (INDEPENDENT_AMBULATORY_CARE_PROVIDER_SITE_OTHER): Admitting: Obstetrics and Gynecology

## 2023-08-30 ENCOUNTER — Other Ambulatory Visit (HOSPITAL_COMMUNITY)
Admission: RE | Admit: 2023-08-30 | Discharge: 2023-08-30 | Disposition: A | Discharge status: Home / Self Care | Source: Ambulatory Visit | Attending: Obstetrics and Gynecology | Admitting: Obstetrics and Gynecology

## 2023-08-30 ENCOUNTER — Other Ambulatory Visit: Payer: Self-pay

## 2023-08-30 VITALS — BP 125/90 | HR 118 | Wt 261.1 lb

## 2023-08-30 DIAGNOSIS — B977 Papillomavirus as the cause of diseases classified elsewhere: Secondary | ICD-10-CM | POA: Diagnosis not present

## 2023-08-30 DIAGNOSIS — N888 Other specified noninflammatory disorders of cervix uteri: Secondary | ICD-10-CM | POA: Diagnosis not present

## 2023-08-30 DIAGNOSIS — N72 Inflammatory disease of cervix uteri: Secondary | ICD-10-CM | POA: Diagnosis not present

## 2023-08-30 DIAGNOSIS — R87612 Low grade squamous intraepithelial lesion on cytologic smear of cervix (LGSIL): Secondary | ICD-10-CM | POA: Insufficient documentation

## 2023-08-30 DIAGNOSIS — Z3202 Encounter for pregnancy test, result negative: Secondary | ICD-10-CM

## 2023-08-30 DIAGNOSIS — N870 Mild cervical dysplasia: Secondary | ICD-10-CM | POA: Diagnosis not present

## 2023-08-30 NOTE — Progress Notes (Signed)
    GYNECOLOGY OFFICE COLPOSCOPY PROCEDURE NOTE  28 y.o. G0P0000 here for colposcopy for low-grade squamous intraepithelial neoplasia (LGSIL - encompassing HPV,mild dysplasia,CIN I) pap smear on 06/06/23. Discussed role for HPV in cervical dysplasia, need for surveillance.  Patient gave informed written consent, time out was performed.  Placed in lithotomy position. Cervix viewed with speculum and colposcope after application of acetic acid.   Colposcopy adequate? Yes  acetowhite lesion(s) noted at 11 o'clock; corresponding biopsies obtained.  ECC specimen obtained. Silver nitrate applied to biopsy site All specimens were labeled and sent to pathology.  Chaperone was present during entire procedure.  Patient was given post procedure instructions.  Will follow up pathology and manage accordingly; patient will be contacted with results and recommendations.  Routine preventative health maintenance measures emphasized.   Kiki Pelton, MD, FACOG Minimally Invasive Gynecologic Surgery  Obstetrics and Gynecology, [facility] for [facility], [facility] 08/30/2023

## 2023-08-30 NOTE — Patient Instructions (Signed)
 Colposcopy, Care After  The following information offers guidance on how to care for yourself after your procedure. Your doctor may also give you more specific instructions. If you have problems or questions, contact your doctor. What can I expect after the procedure? If you did not have a sample of your tissue taken out (did not have a biopsy), you may only have some spotting of blood for a few days. You can go back to your normal activities. If you had a sample of your tissue taken out, it is common to have: Soreness and mild pain. These may last for a few days. Mild bleeding or fluid (discharge) coming from your vagina. The fluid will look dark and grainy. You may have this for a few days. The fluid may be caused by a liquid that was used during your procedure. You may need to wear a sanitary pad. Spotting of blood for at least 48 hours after the procedure. Follow these instructions at home: Medicines Take over-the-counter and prescription medicines only as told by your doctor. Ask your doctor what over-the-counter pain medicines and prescription medicines you can start taking again. This is very important if you take blood thinners. Activity For at least 3 days, or for as long as told by your doctor, avoid: Douching. Using tampons. Having sex. Return to your normal activities as told by your doctor. Ask your doctor what activities are safe for you. General instructions Ask your doctor if you may take baths, swim, or use a hot tub. You may take showers. If you use birth control (contraception), keep using it. Keep all follow-up visits. Contact a doctor if: You have a fever or chills. You faint or feel light-headed. Get help right away if: You bleed a lot from your vagina. A lot of bleeding means that the bleeding soaks through a pad in less than 1 hour. You have clumps of blood (blood clots) coming from your vagina. You have signs that could mean you have an infection. This may be  fluid coming from your vagina that is: Different than normal. Yellow. Bad-smelling. You have very bad pain or cramps in your lower belly that do not get better with medicine. Summary If you did not have a sample of your tissue taken out, you may only have some spotting of blood for a few days. You can go back to your normal activities. If you had a sample of your tissue taken out, it is common to have mild pain for a few days and spotting for 48 hours. Avoid douching, using tampons, and having sex for at least 3 days after the procedure or for as long as told. Get help right away if you have a lot of bleeding, very bad pain, or signs of infection. This information is not intended to replace advice given to you by your health care provider. Make sure you discuss any questions you have with your health care provider. Document Revised: 08/03/2020 Document Reviewed: 08/03/2020 Elsevier Patient Education  2024 [employer].

## 2023-08-31 LAB — POCT PREGNANCY, URINE: Preg Test, Ur: NEGATIVE

## 2023-09-01 ENCOUNTER — Ambulatory Visit: Payer: Self-pay | Admitting: Obstetrics and Gynecology

## 2023-09-01 ENCOUNTER — Encounter: Admitting: Family Medicine

## 2023-09-01 LAB — SURGICAL PATHOLOGY

## 2023-09-01 NOTE — Progress Notes (Signed)
 Per chart review, pt read provider MyChart message for result and recommendation.   Carolynne Citron, RN

## 2023-09-12 ENCOUNTER — Other Ambulatory Visit (HOSPITAL_COMMUNITY): Payer: Self-pay

## 2023-10-06 ENCOUNTER — Telehealth: Payer: Self-pay

## 2023-10-06 NOTE — Telephone Encounter (Signed)
 Copied from CRM [id number]. Topic: Clinical - Request for Lab/Test Order >> Oct 06, 2023 10:59 AM [name] wrote: Reason for CRM:  Please call or message through MyChart to discuss what shots she needs for school. [employer] brought the list of shots that are last week and she needs this by 10/15/23 because she going on vacation. Her number is [phone]

## 2023-10-12 ENCOUNTER — Ambulatory Visit: Admitting: Family Medicine

## 2023-10-12 ENCOUNTER — Encounter: Payer: Self-pay | Admitting: Family Medicine

## 2023-10-12 VITALS — BP 122/88 | HR 95 | Temp 98.3°F | Ht 67.0 in | Wt 265.8 lb

## 2023-10-12 DIAGNOSIS — Z0000 Encounter for general adult medical examination without abnormal findings: Secondary | ICD-10-CM | POA: Diagnosis not present

## 2023-10-12 DIAGNOSIS — Z23 Encounter for immunization: Secondary | ICD-10-CM

## 2023-10-12 DIAGNOSIS — Z0184 Encounter for antibody response examination: Secondary | ICD-10-CM | POA: Diagnosis not present

## 2023-10-12 DIAGNOSIS — Z111 Encounter for screening for respiratory tuberculosis: Secondary | ICD-10-CM | POA: Diagnosis not present

## 2023-10-12 NOTE — Progress Notes (Signed)
 Complete physical exam  Patient: Morgan Stanley   DOB: [birth year]   28 y.o. Female  MRN: [id number]  Subjective:    Chief Complaint  Patient presents with   Annual Exam    Morgan Stanley is a 28 y.o. female who presents today for a complete physical exam. She reports consuming a general diet. The patient has a physically strenuous job, but has no regular exercise apart from work.  She generally feels well. She reports sleeping well. She does not have additional problems to discuss today.   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   Most recent fall risk assessment:    06/17/2023    8:12 AM  Fall Risk   Falls in the past year? 0  Risk for fall due to : No Fall Risks  Follow up Falls evaluation completed     Most recent depression screenings:    08/30/2023    5:00 PM 06/17/2023    8:12 AM  PHQ 2/9 Scores  PHQ - 2 Score 0 1  PHQ- 9 Score 0 6    Vision:Within last year and Dental: No current dental problems  No past medical history on file.    Patient Care Team: Joesph Annabella HERO, FNP as PCP - General (Family Medicine)   Outpatient Medications Prior to Visit  Medication Sig   norethindrone-ethinyl estradiol-FE (LOESTRIN FE) 1-20 MG-MCG tablet Take 1 tablet by mouth daily.   omeprazole  (PRILOSEC) 20 MG capsule Take 1 capsule (20 mg total) by mouth daily.   No facility-administered medications prior to visit.    ROS Negative unless specially indicated above in HPI.      Objective:     BP 122/88   Pulse 95   Temp 98.3 F (36.8 C) (Temporal)   Ht 5' 7 (1.702 m)   Wt 265 lb 12.8 oz (120.6 kg)   SpO2 98%   BMI 41.63 kg/m    Physical Exam Vitals and nursing note reviewed.  Constitutional:      General: She is not in acute distress.    Appearance: Normal appearance. She is not ill-appearing, toxic-appearing or diaphoretic.  HENT:     Head: Normocephalic.     Right Ear: Tympanic membrane, ear canal and external ear  normal.     Left Ear: Tympanic membrane, ear canal and external ear normal.     Nose: Nose normal.     Mouth/Throat:     Mouth: Mucous membranes are moist.     Pharynx: Oropharynx is clear.  Eyes:     Extraocular Movements: Extraocular movements intact.     Conjunctiva/sclera: Conjunctivae normal.     Pupils: Pupils are equal, round, and reactive to light.  Neck:     Thyroid: No thyroid mass, thyromegaly or thyroid tenderness.  Cardiovascular:     Rate and Rhythm: Normal rate and regular rhythm.     Pulses: Normal pulses.     Heart sounds: Normal heart sounds. No murmur heard.    No friction rub. No gallop.  Pulmonary:     Effort: Pulmonary effort is normal.     Breath sounds: Normal breath sounds.  Abdominal:     General: Bowel sounds are normal. There is no distension.     Palpations: Abdomen is soft. There is no mass.     Tenderness: There is no abdominal tenderness. There is no guarding.  Musculoskeletal:     Cervical back: Normal range of motion and neck supple. No tenderness.  Right lower leg: No edema.     Left lower leg: No edema.  Skin:    General: Skin is warm and dry.     Capillary Refill: Capillary refill takes less than 2 seconds.     Findings: No lesion or rash.  Neurological:     General: No focal deficit present.     Mental Status: She is alert and oriented to person, place, and time.     Cranial Nerves: No cranial nerve deficit.     Motor: No weakness.     Gait: Gait normal.  Psychiatric:        Mood and Affect: Mood normal.        Behavior: Behavior normal.        Thought Content: Thought content normal.        Judgment: Judgment normal.      No results found for any visits on 10/12/23.     Assessment & Plan:    Routine Health Maintenance and Physical Exam  Elis was seen today for annual exam.  Diagnoses and all orders for this visit:  Routine general medical examination at a health care facility  Immunity status testing -      Measles/Mumps/Rubella Immunity -     Varicella zoster antibody, IgG  Screening-pulmonary TB -     QuantiFERON-TB Gold Plus -     QuantiFERON-TB Gold Plus  Need for vaccination -     Tdap vaccine greater than or equal to 7yo IM -     Heplisav-B  (HepB-CPG) Vaccine  Will emails forms for school pending lab results.   Immunization History  Administered Date(s) Administered   Hepb-cpg 10/12/2023   Influenza-Unspecified 12/21/2022   PPD Test 05/06/2021   Tdap 10/12/2023    Health Maintenance  Topic Date Due   COVID-19 Vaccine (1 - 2024-25 season) Never done   HPV VACCINES (1 - 3-dose series) 02/16/2024 (Originally 03/26/2012)   Pneumococcal Vaccine 1-64 Years old (1 of 2 - PCV) 05/16/2024 (Originally 03/26/2016)   INFLUENZA VACCINE  10/21/2023   Hepatitis B Vaccines (2 of 2 - CpG 2-dose series) 11/09/2023   Cervical Cancer Screening (Pap smear)  06/06/2026   DTaP/Tdap/Td (2 - Td or Tdap) 10/11/2033   Hepatitis C Screening  Completed   HIV Screening  Completed   Meningococcal B Vaccine  Aged Out    Discussed health benefits of physical activity, and encouraged her to engage in regular exercise appropriate for her age and condition.  Problem List Items Addressed This Visit   None Visit Diagnoses       Screening-pulmonary TB    -  Primary   Relevant Orders   QuantiFERON-TB Gold Plus   QuantiFERON-TB Gold Plus     Routine general medical examination at a health care facility         Immunity status testing       Relevant Orders   Measles/Mumps/Rubella Immunity   Varicella zoster antibody, IgG      Return in about 1 year (around 10/11/2024) for CPE.   The patient indicates understanding of these issues and agrees with the plan.  Annabella CHRISTELLA Search, FNP

## 2023-10-12 NOTE — Patient Instructions (Signed)

## 2023-10-13 LAB — MEASLES/MUMPS/RUBELLA IMMUNITY
MUMPS ABS, IGG: 99.4 [AU]/ml (ref >10.9)
RUBEOLA AB, IGG: 69.6 [AU]/ml (ref >16.4)
Rubella Antibodies, IGG: 3.28 {index} (ref >0.99)

## 2023-10-13 LAB — VARICELLA ZOSTER ANTIBODY, IGG: Varicella zoster IgG: REACTIVE

## 2023-10-16 LAB — QUANTIFERON-TB GOLD PLUS
QuantiFERON Mitogen Value: >10 [IU]/mL
QuantiFERON Nil Value: 0.02 [IU]/mL
QuantiFERON TB1 Ag Value: 0.02 [IU]/mL
QuantiFERON TB2 Ag Value: 0.03 [IU]/mL
QuantiFERON-TB Gold Plus: NEGATIVE

## 2023-10-17 ENCOUNTER — Ambulatory Visit: Payer: Self-pay | Admitting: Family Medicine

## 2023-10-26 ENCOUNTER — Encounter: Payer: Self-pay | Admitting: Family Medicine

## 2024-01-06 ENCOUNTER — Other Ambulatory Visit (HOSPITAL_BASED_OUTPATIENT_CLINIC_OR_DEPARTMENT_OTHER): Payer: Self-pay

## 2024-01-06 ENCOUNTER — Ambulatory Visit: Admitting: *Deleted

## 2024-01-06 DIAGNOSIS — Z23 Encounter for immunization: Secondary | ICD-10-CM | POA: Diagnosis not present

## 2024-01-06 NOTE — Progress Notes (Signed)
 Patient is in office today for a nurse visit for Immunization. Patient Injection was given in the  Right deltoid. Patient tolerated injection well.

## 2024-01-09 ENCOUNTER — Other Ambulatory Visit (HOSPITAL_BASED_OUTPATIENT_CLINIC_OR_DEPARTMENT_OTHER): Payer: Self-pay

## 2024-01-09 MED ORDER — NORETHIN ACE-ETH ESTRAD-FE 1-20 MG-MCG PO TABS
1.0000 | ORAL_TABLET | Freq: Every day | ORAL | 3 refills | Status: AC
  Filled 2024-01-09 – 2024-01-12 (×3): qty 84, 84d supply, fill #0
  Filled 2024-03-30: qty 84, 84d supply, fill #1
  Filled 2024-03-30: qty 84, 84d supply, fill #0

## 2024-01-12 ENCOUNTER — Other Ambulatory Visit (HOSPITAL_BASED_OUTPATIENT_CLINIC_OR_DEPARTMENT_OTHER): Payer: Self-pay

## 2024-03-30 ENCOUNTER — Other Ambulatory Visit (HOSPITAL_BASED_OUTPATIENT_CLINIC_OR_DEPARTMENT_OTHER): Payer: Self-pay

## 2024-03-30 ENCOUNTER — Other Ambulatory Visit (HOSPITAL_COMMUNITY): Payer: Self-pay

## 2024-06-20 ENCOUNTER — Encounter: Payer: Self-pay | Admitting: Family Medicine
